# Patient Record
Sex: Female | Born: 1955 | Race: White | Hispanic: No | Marital: Married | Smoking: Never smoker
Health system: Southern US, Community
[De-identification: ages and names within clinical notes are randomized; demographics above are authoritative.]

---

## 2004-10-01 ENCOUNTER — Ambulatory Visit: Payer: Self-pay | Admitting: Gastroenterology

## 2008-04-15 ENCOUNTER — Emergency Department: Payer: Self-pay | Admitting: Unknown Physician Specialty

## 2009-09-17 ENCOUNTER — Telehealth: Payer: Self-pay | Admitting: Internal Medicine

## 2009-12-17 ENCOUNTER — Ambulatory Visit: Payer: Self-pay | Admitting: Gastroenterology

## 2011-08-30 ENCOUNTER — Emergency Department: Payer: Self-pay | Admitting: Unknown Physician Specialty

## 2012-12-09 ENCOUNTER — Ambulatory Visit: Payer: Self-pay | Admitting: Physician Assistant

## 2014-02-20 ENCOUNTER — Emergency Department: Payer: Self-pay | Admitting: Emergency Medicine

## 2018-10-18 ENCOUNTER — Encounter: Payer: Self-pay | Admitting: Podiatry

## 2018-10-18 ENCOUNTER — Ambulatory Visit: Payer: No Typology Code available for payment source | Admitting: Podiatry

## 2018-10-18 ENCOUNTER — Ambulatory Visit (INDEPENDENT_AMBULATORY_CARE_PROVIDER_SITE_OTHER): Payer: No Typology Code available for payment source

## 2018-10-18 VITALS — BP 134/68 | HR 73 | Resp 16

## 2018-10-18 DIAGNOSIS — M722 Plantar fascial fibromatosis: Secondary | ICD-10-CM | POA: Diagnosis not present

## 2018-10-18 DIAGNOSIS — M2011 Hallux valgus (acquired), right foot: Secondary | ICD-10-CM | POA: Diagnosis not present

## 2018-10-18 DIAGNOSIS — M2012 Hallux valgus (acquired), left foot: Secondary | ICD-10-CM

## 2018-10-18 NOTE — Progress Notes (Signed)
  Subjective:  Patient ID: Bonnie Duran, female    DOB: 08-07-1956,  MRN: [id number] HPI Chief Complaint  Patient presents with  . 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 New Patient (Initial Visit)    62 y.o. female presents with the above complaint.   ROS: Denies fever chills nausea vomiting muscle aches pains back pain chest pain shortness of breath.  No past medical history on file.   Current Outpatient Medications:  .  Loperamide HCl (IMODIUM PO), Take by mouth., Disp: , Rfl:  .  Wheat Dextrin (BENEFIBER PO), Take by mouth., Disp: , Rfl:   Allergies  Allergen Reactions  . Codeine Nausea Only  . Penicillin G Nausea Only    Pt does not do well with any antibiotics   Review of Systems Objective:   Vitals:   10/18/18 0841  BP: 134/68  Pulse: 73  Resp: 16    General: Well developed, nourished, in no acute distress, alert and oriented x3   Dermatological: Skin is warm, dry and supple bilateral. Nails x 10 are well maintained; remaining integument appears unremarkable at this time. There are no open sores, no preulcerative lesions, no rash or signs of infection present.  Vascular: Dorsalis Pedis artery and Posterior Tibial artery pedal pulses are 2/4 bilateral with immedate capillary fill time. Pedal hair growth present. No varicosities and no lower extremity edema present bilateral.   Neruologic: Grossly intact via light touch bilateral. Vibratory intact via tuning fork bilateral. Protective threshold with Semmes Wienstein monofilament intact to all pedal sites bilateral. Patellar and Achilles deep tendon reflexes 2+ bilateral. No Babinski or clonus noted bilateral.   Musculoskeletal: No gross boney pedal deformities bilateral. No pain, crepitus, or limitation noted with foot and ankle range of motion bilateral. Muscular strength 5/5 in all  groups tested bilateral.  Hallux abductovalgus deformity bilateral she also has some perineal pain left the plantar fascial pain that is significant no Achilles pain significant.  Gait: Unassisted, Nonantalgic.    Radiographs:  Radiographs taken today demonstrate osseously mature individual with pes planus.  No acute findings.  Assessment & Plan:   Assessment: Pes planus with mild peroneal tendinitis left  Plan: She was scanned for set of orthotics today.     Kaina Orengo T. Oak[place]Hyatt, [place]PM

## 2018-11-08 ENCOUNTER — Ambulatory Visit: Payer: No Typology Code available for payment source | Admitting: Orthotics

## 2018-11-08 DIAGNOSIS — M2011 Hallux valgus (acquired), right foot: Secondary | ICD-10-CM

## 2018-11-08 DIAGNOSIS — M722 Plantar fascial fibromatosis: Secondary | ICD-10-CM

## 2018-11-08 DIAGNOSIS — M2012 Hallux valgus (acquired), left foot: Secondary | ICD-10-CM

## 2018-11-08 NOTE — Progress Notes (Signed)
Patient came in today to pick up custom made foot orthotics.  The goals were accomplished and the patient reported no dissatisfaction with said orthotics.  Patient was advised of breakin period and how to report any issues. 

## 2018-11-15 ENCOUNTER — Ambulatory Visit: Payer: No Typology Code available for payment source | Admitting: Orthotics

## 2018-11-15 DIAGNOSIS — M722 Plantar fascial fibromatosis: Secondary | ICD-10-CM

## 2018-11-15 DIAGNOSIS — M2012 Hallux valgus (acquired), left foot: Secondary | ICD-10-CM

## 2018-11-15 DIAGNOSIS — M2011 Hallux valgus (acquired), right foot: Secondary | ICD-10-CM

## 2018-11-15 NOTE — Progress Notes (Signed)
Make following f/o adjustments:  Hug arch r>l; remove 2nd offload instead 1st offload b/l, 1st met head cutout, 4/small met pad b/l.

## 2018-12-13 ENCOUNTER — Other Ambulatory Visit: Payer: No Typology Code available for payment source | Admitting: Orthotics

## 2018-12-20 ENCOUNTER — Ambulatory Visit: Payer: No Typology Code available for payment source | Admitting: Orthotics

## 2018-12-20 DIAGNOSIS — M2012 Hallux valgus (acquired), left foot: Secondary | ICD-10-CM

## 2018-12-20 DIAGNOSIS — M2011 Hallux valgus (acquired), right foot: Secondary | ICD-10-CM

## 2018-12-20 DIAGNOSIS — M722 Plantar fascial fibromatosis: Secondary | ICD-10-CM

## 2018-12-20 NOTE — Progress Notes (Signed)
Patient came in today to pick up custom made foot orthotics.  The goals were accomplished and the patient reported no dissatisfaction with said orthotics.  Patient was advised of breakin period and how to report any issues. 

## 2020-06-08 ENCOUNTER — Emergency Department (HOSPITAL_COMMUNITY): Payer: Medicare HMO

## 2020-06-08 ENCOUNTER — Encounter (HOSPITAL_COMMUNITY): Payer: Self-pay | Admitting: Emergency Medicine

## 2020-06-08 ENCOUNTER — Emergency Department (HOSPITAL_COMMUNITY)
Admission: EM | Admit: 2020-06-08 | Discharge: 2020-06-08 | Disposition: A | Discharge status: Home / Self Care | Payer: Medicare HMO | Attending: Emergency Medicine | Admitting: Emergency Medicine

## 2020-06-08 DIAGNOSIS — Y999 Unspecified external cause status: Secondary | ICD-10-CM | POA: Insufficient documentation

## 2020-06-08 DIAGNOSIS — S4991XA Unspecified injury of right shoulder and upper arm, initial encounter: Secondary | ICD-10-CM | POA: Diagnosis present

## 2020-06-08 DIAGNOSIS — W06XXXA Fall from bed, initial encounter: Secondary | ICD-10-CM | POA: Insufficient documentation

## 2020-06-08 DIAGNOSIS — Y92003 Bedroom of unspecified non-institutional (private) residence as the place of occurrence of the external cause: Secondary | ICD-10-CM | POA: Insufficient documentation

## 2020-06-08 DIAGNOSIS — S42214A Unspecified nondisplaced fracture of surgical neck of right humerus, initial encounter for closed fracture: Secondary | ICD-10-CM | POA: Insufficient documentation

## 2020-06-08 DIAGNOSIS — Y939 Activity, unspecified: Secondary | ICD-10-CM | POA: Diagnosis not present

## 2020-06-08 MED ORDER — HYDROCODONE-ACETAMINOPHEN 5-325 MG PO TABS: 1.0000 | ORAL_TABLET | Freq: Four times a day (QID) | ORAL | 0 refills | Status: AC | PRN

## 2020-06-08 MED ORDER — CYCLOBENZAPRINE HCL 5 MG PO TABS
5.0000 mg | ORAL_TABLET | Freq: Two times a day (BID) | ORAL | 0 refills | Status: DC | PRN
Start: 2020-06-08 — End: 2020-06-08

## 2020-06-08 MED ORDER — ONDANSETRON 4 MG PO TBDP
4.0000 mg | ORAL_TABLET | Freq: Three times a day (TID) | ORAL | 0 refills | Status: DC | PRN
Start: 2020-06-08 — End: 2020-06-08

## 2020-06-08 MED ORDER — KETOROLAC TROMETHAMINE 60 MG/2ML IM SOLN
15.0000 mg | Freq: Once | INTRAMUSCULAR | Status: AC
  Administered 2020-06-08: 15 mg via INTRAMUSCULAR
  Filled 2020-06-08: qty 2

## 2020-06-08 MED ORDER — IBUPROFEN 600 MG PO TABS
600.0000 mg | ORAL_TABLET | Freq: Four times a day (QID) | ORAL | 0 refills | Status: AC | PRN
Start: 2020-06-08 — End: ?

## 2020-06-08 MED ORDER — HYDROCODONE-ACETAMINOPHEN 5-325 MG PO TABS: 1.0000 | ORAL_TABLET | Freq: Four times a day (QID) | ORAL | 0 refills | Status: DC | PRN

## 2020-06-08 MED ORDER — FENTANYL CITRATE (PF) 100 MCG/2ML IJ SOLN: 25.0000 ug | Freq: Once | INTRAMUSCULAR | Status: DC

## 2020-06-08 MED ORDER — ONDANSETRON 4 MG PO TBDP: 4.0000 mg | ORAL_TABLET | Freq: Three times a day (TID) | ORAL | 0 refills | Status: AC | PRN

## 2020-06-08 MED ORDER — OXYCODONE-ACETAMINOPHEN 5-325 MG PO TABS
1.0000 | ORAL_TABLET | Freq: Once | ORAL | Status: AC
  Administered 2020-06-08: 1 via ORAL
  Filled 2020-06-08: qty 1

## 2020-06-08 MED ORDER — CYCLOBENZAPRINE HCL 5 MG PO TABS
5.0000 mg | ORAL_TABLET | Freq: Two times a day (BID) | ORAL | 0 refills | Status: AC | PRN
Start: 2020-06-08 — End: ?

## 2020-06-08 MED ORDER — IBUPROFEN 600 MG PO TABS: 600.0000 mg | ORAL_TABLET | Freq: Four times a day (QID) | ORAL | 0 refills | Status: DC | PRN

## 2020-06-08 NOTE — ED Triage Notes (Signed)
Pt. Stated, I went to sit on the bed and missed the bed fell and grabbed with rt. Arm. Pain in rt. Upper arm and shoulder.

## 2020-06-08 NOTE — Discharge Instructions (Addendum)
1. Medications: Alternate 600 mg of ibuprofen and [phone] mg of Tylenol every 3 hours as needed for pain. Do not exceed 4000 mg of Tylenol daily.  Take ibuprofen with food to avoid upset stomach issues. You can take hydrocodone as needed for severe pain but do not drive, drink alcohol, or operate heavy machinery while taking this medicine as it can cause drowsiness.  Be aware this medicine also contains Tylenol and do not exceed more than 4000 mg of Tylenol daily.  You can also take Flexeril which is a muscle relaxant.  This medicine can also cause drowsiness.  You can cut both of these tablets in half if they are strong.  I have prescribed Zofran to be taken as needed for nausea as a result of the medications. 2. Treatment: rest, ice, wear shoulder sling, drink plenty of fluids, gentle stretching 3. Follow Up: Please followup with orthopedics as directed for discussion of your diagnoses and further evaluation after today's visit; call first thing Tuesday morning to schedule follow-up.; Please return to the ER for worsening symptoms or other concerns such as worsening swelling, redness of the skin, fevers, loss of pulses, or loss of feeling

## 2020-06-08 NOTE — ED Provider Notes (Signed)
[facility] EMERGENCY DEPARTMENT Provider Note   CSN: [id number] Arrival date & time: 06/08/20  6644     History Chief Complaint  Patient presents with  . Arm Pain  . Shoulder Pain    Bonnie Duran is a 64 y.o. female with history of IBS, osteoporosis presents for evaluation of acute onset, persistent severe right upper extremity pain secondary to injury just prior to arrival.  She reports that she was attempting to sit on the edge of her bed with her right upper extremity extended when she missed the edge of the bed, fell forward and landed on her right upper extremity.  She reports severe pain around the humeral head extending down the extremity and up into the neck.  Has not been able to move the extremity since the injury.  Denies head injury or loss of consciousness.  She does feel some numbness and tingling to her fingers.  She is right-hand dominant.  The history is provided by the patient.  Shoulder Pain      History reviewed. No pertinent past medical history.  There are no problems to display for this patient.   History reviewed. No pertinent surgical history.   OB History   No obstetric history on file.     No family history on file.  Social History   Tobacco Use  . Smoking status: Never Smoker  . Smokeless tobacco: Never Used  Substance Use Topics  . Alcohol use: Yes  . Drug use: Not Currently    Home Medications Prior to Admission medications   Medication Sig Start Date End Date Taking? Authorizing Provider  cyclobenzaprine (FLEXERIL) 5 MG tablet Take 1 tablet (5 mg total) by mouth 2 (two) times daily as needed for muscle spasms. 06/08/20   Melene Plan, DO  HYDROcodone-acetaminophen (NORCO/VICODIN) 5-325 MG tablet Take 1 tablet by mouth every 6 (six) hours as needed for severe pain. 06/08/20   Melene Plan, DO  ibuprofen (ADVIL) 600 MG tablet Take 1 tablet (600 mg total) by mouth every 6 (six) hours as needed. 06/08/20   Melene Plan, DO    Loperamide HCl (IMODIUM PO) Take by mouth.    [provider]  ondansetron (ZOFRAN ODT) 4 MG disintegrating tablet Take 1 tablet (4 mg total) by mouth every 8 (eight) hours as needed for nausea or vomiting. 06/08/20   Melene Plan, DO  Wheat Dextrin (BENEFIBER PO) Take by mouth.    [provider]    Allergies    Codeine and Penicillin g  Review of Systems   Review of Systems  Musculoskeletal: Positive for arthralgias.  Neurological: Positive for numbness. Negative for syncope and headaches.  All other systems reviewed and are negative.   Physical Exam Updated Vital Signs BP 115/68 (BP Location: Left Arm)   Pulse 65   Temp 97.7 F (36.5 C) (Oral)   Resp 20   SpO2 98%   Physical Exam Vitals and nursing note reviewed.  Constitutional:      General: She is not in acute distress.    Appearance: She is well-developed.  HENT:     Head: Normocephalic and atraumatic.  Eyes:     General:        Right eye: No discharge.        Left eye: No discharge.     Conjunctiva/sclera: Conjunctivae normal.  Neck:     Vascular: No JVD.     Trachea: No tracheal deviation.     Comments: No midline cervical  spine tenderness, deformity, crepitus, or step-off.  She is kyphotic.  There is right paracervical muscle tenderness and spasm noted. Cardiovascular:     Rate and Rhythm: Normal rate.     Pulses: Normal pulses.     Comments: 2+ radial pulses bilaterally. Pulmonary:     Effort: Pulmonary effort is normal.  Abdominal:     General: There is no distension.  Musculoskeletal:        General: Tenderness present.     Cervical back: Normal range of motion and neck supple.     Comments: Significant limitation in the setting of pain.  She has tenderness to palpation of the right humeral head and proximal humerus.  No tenderness to palpation of the right elbow, forearm, wrist or digits.  Skin:    General: Skin is warm and dry.     Findings: No erythema.  Neurological:     Mental  Status: She is alert.     Comments: Slightly altered sensation to light touch of the right upper extremity as compared to the left.  Good grip strength bilaterally.  Psychiatric:        Behavior: Behavior normal.     ED Results / Procedures / Treatments   Labs (all labs ordered are listed, but only abnormal results are displayed) Labs Reviewed - No data to display  EKG None  Radiology DG Shoulder Right  Result Date: 06/08/2020 CLINICAL DATA:  Fall, pain EXAM: RIGHT SHOULDER - 2+ VIEW; RIGHT HUMERUS - 2+ VIEW COMPARISON:  None. FINDINGS: Impacted fracture of the proximal right humerus involving the surgical neck. No fracture or dislocation of the distal right humerus. The acromioclavicular and glenohumeral joints are preserved. Partially imaged right chest is unremarkable. IMPRESSION: 1. Impacted fracture of the proximal right humerus involving the surgical neck. Consider CT to further evaluate fracture anatomy. 2. No fracture or dislocation of the distal right humerus. Electronically Signed   By: Lauralyn Primes M.D.   On: 06/08/2020 10:51   DG Humerus Right  Result Date: 06/08/2020 CLINICAL DATA:  Fall, pain EXAM: RIGHT SHOULDER - 2+ VIEW; RIGHT HUMERUS - 2+ VIEW COMPARISON:  None. FINDINGS: Impacted fracture of the proximal right humerus involving the surgical neck. No fracture or dislocation of the distal right humerus. The acromioclavicular and glenohumeral joints are preserved. Partially imaged right chest is unremarkable. IMPRESSION: 1. Impacted fracture of the proximal right humerus involving the surgical neck. Consider CT to further evaluate fracture anatomy. 2. No fracture or dislocation of the distal right humerus. Electronically Signed   By: Lauralyn Primes M.D.   On: 06/08/2020 10:51    Procedures Procedures (including critical care time)  Medications Ordered in ED Medications  oxyCODONE-acetaminophen (PERCOCET/ROXICET) 5-325 MG per tablet 1 tablet (1 tablet Oral Given 06/08/20 1004)    ketorolac (TORADOL) injection 15 mg (15 mg Intramuscular Given 06/08/20 1302)    ED Course  I have reviewed the triage vital signs and the nursing notes.  Pertinent labs & imaging results that were available during my care of the patient were reviewed by me and considered in my medical decision making (see chart for details).    MDM Rules/Calculators/A&P                          Patient presenting for evaluation of right shoulder/upper arm pain secondary to mechanical fall.  Or loss of consciousness.  No midline spine tenderness.  Has a history of osteoporosis.  She is afebrile, appears  uncomfortable but vital signs are stable.  Compartments are soft.  She is able to move the extremity distally but limited proximally.  Radiographs confirm an impacted fracture involving the proximal right humeral neck.  Examination of chest otherwise atraumatic.  Placed in shoulder sling and she will follow-up with orthopedics outpatient.  We will give Flexeril for muscle relaxation, hydrocodone for severe breakthrough pain.  Will give Zofran as needed for nausea related to medication use.  Discussed potential side effects.  Discussed ED return precautions.  And hemodynamically stable for discharge at this time.  Patient seen and evaluated by Dr. Adela Lank who agrees with assessment and plan at this time  Final Clinical Impression(s) / ED Diagnoses Final diagnoses:  Closed nondisplaced fracture of surgical neck of right humerus, unspecified fracture morphology, initial encounter    Rx / DC Orders ED Discharge Orders         Ordered    ibuprofen (ADVIL) 600 MG tablet  Every 6 hours PRN,   Status:  Discontinued     Reprint     06/08/20 1152    HYDROcodone-acetaminophen (NORCO/VICODIN) 5-325 MG tablet  Every 6 hours PRN,   Status:  Discontinued     Reprint     06/08/20 1152    cyclobenzaprine (FLEXERIL) 5 MG tablet  2 times daily PRN,   Status:  Discontinued     Reprint     06/08/20 1152    ondansetron (ZOFRAN  ODT) 4 MG disintegrating tablet  Every 8 hours PRN,   Status:  Discontinued     Reprint     06/08/20 1152    cyclobenzaprine (FLEXERIL) 5 MG tablet  2 times daily PRN     Discontinue  Reprint     06/08/20 1255    HYDROcodone-acetaminophen (NORCO/VICODIN) 5-325 MG tablet  Every 6 hours PRN     Discontinue  Reprint     06/08/20 1255    ibuprofen (ADVIL) 600 MG tablet  Every 6 hours PRN     Discontinue  Reprint     06/08/20 1255    ondansetron (ZOFRAN ODT) 4 MG disintegrating tablet  Every 8 hours PRN     Discontinue  Reprint     06/08/20 1255           Rylyn Zawistowski, [place] A, PA-C 06/08/20 1317    Melene Plan, DO 06/08/20 1428

## 2020-11-25 ENCOUNTER — Ambulatory Visit: Payer: Medicare HMO

## 2020-12-12 ENCOUNTER — Other Ambulatory Visit
Admission: RE | Admit: 2020-12-12 | Discharge: 2020-12-12 | Disposition: A | Discharge status: Home / Self Care | Payer: Medicare HMO | Source: Ambulatory Visit | Attending: Gastroenterology | Admitting: Gastroenterology

## 2020-12-12 DIAGNOSIS — Z01818 Encounter for other preprocedural examination: Secondary | ICD-10-CM | POA: Insufficient documentation

## 2020-12-12 DIAGNOSIS — Z20822 Contact with and (suspected) exposure to covid-19: Secondary | ICD-10-CM | POA: Diagnosis not present

## 2020-12-13 LAB — SARS CORONAVIRUS 2 (TAT 6-24 HRS): SARS Coronavirus 2: NEGATIVE

## 2020-12-16 ENCOUNTER — Encounter: Payer: Self-pay | Admitting: *Deleted

## 2020-12-16 ENCOUNTER — Ambulatory Visit
Admission: RE | Admit: 2020-12-16 | Discharge: 2020-12-16 | Disposition: A | Discharge status: Home / Self Care | Payer: Medicare HMO | Attending: Gastroenterology | Admitting: Gastroenterology

## 2020-12-16 ENCOUNTER — Encounter
Admission: RE | Disposition: A | Discharge status: Home / Self Care | Payer: Self-pay | Source: Home / Self Care | Attending: Gastroenterology

## 2020-12-16 ENCOUNTER — Ambulatory Visit: Payer: Medicare HMO | Admitting: Anesthesiology

## 2020-12-16 ENCOUNTER — Other Ambulatory Visit: Payer: Self-pay

## 2020-12-16 DIAGNOSIS — K5730 Diverticulosis of large intestine without perforation or abscess without bleeding: Secondary | ICD-10-CM | POA: Diagnosis not present

## 2020-12-16 DIAGNOSIS — Z885 Allergy status to narcotic agent status: Secondary | ICD-10-CM | POA: Diagnosis not present

## 2020-12-16 DIAGNOSIS — K640 First degree hemorrhoids: Secondary | ICD-10-CM | POA: Diagnosis not present

## 2020-12-16 DIAGNOSIS — Z880 Allergy status to penicillin: Secondary | ICD-10-CM | POA: Insufficient documentation

## 2020-12-16 DIAGNOSIS — Z79899 Other long term (current) drug therapy: Secondary | ICD-10-CM | POA: Insufficient documentation

## 2020-12-16 DIAGNOSIS — Z791 Long term (current) use of non-steroidal anti-inflammatories (NSAID): Secondary | ICD-10-CM | POA: Diagnosis not present

## 2020-12-16 DIAGNOSIS — K6289 Other specified diseases of anus and rectum: Secondary | ICD-10-CM | POA: Insufficient documentation

## 2020-12-16 DIAGNOSIS — Z1211 Encounter for screening for malignant neoplasm of colon: Secondary | ICD-10-CM | POA: Insufficient documentation

## 2020-12-16 HISTORY — PX: COLONOSCOPY: SHX5424

## 2020-12-16 SURGERY — COLONOSCOPY
Anesthesia: General

## 2020-12-16 MED ORDER — PROPOFOL 500 MG/50ML IV EMUL
INTRAVENOUS | Status: AC
  Filled 2020-12-16: qty 50

## 2020-12-16 MED ORDER — LIDOCAINE HCL (PF) 2 % IJ SOLN
INTRAMUSCULAR | Status: AC
  Filled 2020-12-16: qty 5

## 2020-12-16 MED ORDER — SODIUM CHLORIDE 0.9 % IV SOLN: INTRAVENOUS | Status: DC

## 2020-12-16 MED ORDER — EPHEDRINE SULFATE 50 MG/ML IJ SOLN
INTRAMUSCULAR | Status: DC | PRN
  Administered 2020-12-16: 10 mg via INTRAVENOUS

## 2020-12-16 MED ORDER — PROPOFOL 10 MG/ML IV BOLUS
INTRAVENOUS | Status: DC | PRN
  Administered 2020-12-16: 30 mg via INTRAVENOUS
  Administered 2020-12-16: 20 mg via INTRAVENOUS
  Administered 2020-12-16: 50 mg via INTRAVENOUS
  Administered 2020-12-16: 10 mg via INTRAVENOUS
  Administered 2020-12-16: 20 mg via INTRAVENOUS

## 2020-12-16 MED ORDER — PROPOFOL 500 MG/50ML IV EMUL
INTRAVENOUS | Status: DC | PRN
  Administered 2020-12-16: 180 ug/kg/min via INTRAVENOUS

## 2020-12-16 NOTE — Transfer of Care (Signed)
Immediate Anesthesia Transfer of Care Note  Patient: Bonnie Duran  Procedure(s) Performed: COLONOSCOPY (N/A )  Patient Location: PACU  Anesthesia Type:General  Level of Consciousness: awake and alert   Airway & Oxygen Therapy: Patient Spontanous Breathing and Patient connected to nasal cannula oxygen  Post-op Assessment: Report given to RN and Post -op Vital signs reviewed and stable  Post vital signs: Reviewed and stable  Last Vitals:  Vitals Value Taken Time  BP 107/58 12/16/20 1008  Temp 36.2 C 12/16/20 1007  Pulse 94 12/16/20 1009  Resp 21 12/16/20 1009  SpO2 100 % 12/16/20 1009  Vitals shown include unvalidated device data.  Last Pain:  Vitals:   12/16/20 1007  TempSrc: Temporal  PainSc: 0-No pain         Complications: No complications documented.

## 2020-12-16 NOTE — Op Note (Signed)
[facility] Patient Name: Bonnie Duran Procedure Date: 12/16/2020 9:25 AM MRN: [id number] Account #: [id number] Date of Birth: 08/10/1956 Admit Type: Outpatient Age: 65 Room: [facility] ENDO ROOM 1 Gender: Female Note Status: Finalized Procedure:             Colonoscopy Indications:           Screening for colorectal malignant neoplasm Providers:             Andrey Farmer MD, MD Referring MD:          Irven Easterly. Kary Kos, MD (Referring MD) Medicines:             Monitored Anesthesia Care Complications:         No immediate complications. Estimated blood loss:                         Minimal. Procedure:             Pre-Anesthesia Assessment:                        - Prior to the procedure, a History and Physical was                         performed, and patient medications and allergies were                         reviewed. The patient is competent. The risks and                         benefits of the procedure and the sedation options and                         risks were discussed with the patient. All questions                         were answered and informed consent was obtained.                         Patient identification and proposed procedure were                         verified by the physician, the nurse, the anesthetist                         and the technician in the endoscopy suite. Mental                         Status Examination: alert and oriented. Airway                         Examination: normal oropharyngeal airway and neck                         mobility. Respiratory Examination: clear to                         auscultation. CV Examination: normal. Prophylactic  Antibiotics: The patient does not require prophylactic                         antibiotics. Prior Anticoagulants: The patient has                         taken no previous anticoagulant or antiplatelet                         agents. ASA  Grade Assessment: II - A patient with mild                         systemic disease. After reviewing the risks and                         benefits, the patient was deemed in satisfactory                         condition to undergo the procedure. The anesthesia                         plan was to use monitored anesthesia care (MAC).                         Immediately prior to administration of medications,                         the patient was re-assessed for adequacy to receive                         sedatives. The heart rate, respiratory rate, oxygen                         saturations, blood pressure, adequacy of pulmonary                         ventilation, and response to care were monitored                         throughout the procedure. The physical status of the                         patient was re-assessed after the procedure.                        After obtaining informed consent, the colonoscope was                         passed under direct vision. Throughout the procedure,                         the patient's blood pressure, pulse, and oxygen                         saturations were monitored continuously. The                         Colonoscope was introduced through the anus and  advanced to the the cecum, identified by appendiceal                         orifice and ileocecal valve. The colonoscopy was                         somewhat difficult due to a redundant colon. The                         patient tolerated the procedure well. The quality of                         the bowel preparation was good. Findings:      The perianal and digital rectal examinations were normal.      A localized area of mildly erythematous mucosa was found in the rectum.       Appeared as if sitting on top of a hemorrhoid although the tissue did       not appear hemorrhoidal in nature. Biopsies were taken with a cold       forceps for histology. Estimated  blood loss was minimal.      A few small-mouthed diverticula were found in the sigmoid colon.      Non-bleeding internal hemorrhoids were found during retroflexion. The       hemorrhoids were Grade I (internal hemorrhoids that do not prolapse).      The exam was otherwise without abnormality on direct and retroflexion       views. Impression:            - Erythematous mucosa in the rectum. Biopsied.                        - Diverticulosis in the sigmoid colon.                        - Non-bleeding internal hemorrhoids.                        - The examination was otherwise normal on direct and                         retroflexion views. Recommendation:        - Discharge patient to home.                        - Resume previous diet.                        - Continue present medications.                        - Await pathology results.                        - Repeat colonoscopy for surveillance based on                         pathology results.                        - Return to referring physician as previously  scheduled. Procedure Code(s):     --- Professional ---                        [phone], Colonoscopy, flexible; with biopsy, single or                         multiple Diagnosis Code(s):     --- Professional ---                        Z12.11, Encounter for screening for malignant neoplasm                         of colon                        K62.89, Other specified diseases of anus and rectum                        K64.0, First degree hemorrhoids                        K57.30, Diverticulosis of large intestine without                         perforation or abscess without bleeding CPT copyright 2019 American Medical Association. All rights reserved. The codes documented in this report are preliminary and upon coder review may  be revised to meet current compliance requirements. Andrey Farmer MD, MD 12/16/2020 10:08:52 AM Number of Addenda: 0 Note  Initiated On: 12/16/2020 9:25 AM Scope Withdrawal Time: 0 hours 13 minutes 36 seconds  Total Procedure Duration: 0 hours 28 minutes 54 seconds  Estimated Blood Loss:  Estimated blood loss was minimal.      [facility]

## 2020-12-16 NOTE — Anesthesia Postprocedure Evaluation (Signed)
Anesthesia Post Note  Patient: Bonnie Duran  Procedure(s) Performed: COLONOSCOPY (N/A )  Patient location during evaluation: Endoscopy Anesthesia Type: General Level of consciousness: awake and alert Pain management: pain level controlled Vital Signs Assessment: post-procedure vital signs reviewed and stable Respiratory status: spontaneous breathing and respiratory function stable Cardiovascular status: stable Anesthetic complications: no   No complications documented.   Last Vitals:  Vitals:   12/16/20 1007 12/16/20 1027  BP: (!) 107/58 (!) 108/57  Pulse:    Resp:    Temp: (!) 36.2 C   SpO2:      Last Pain:  Vitals:   12/16/20 1027  TempSrc:   PainSc: 0-No pain                 Scotty Pinder K

## 2020-12-16 NOTE — Anesthesia Procedure Notes (Signed)
Date/Time: 12/16/2020 9:30 AM Performed by: Henrietta Hoover, CRNA Pre-anesthesia Checklist: Patient identified, Emergency Drugs available, Suction available, Patient being monitored and Timeout performed Oxygen Delivery Method: Nasal cannula Placement Confirmation: positive ETCO2

## 2020-12-16 NOTE — Anesthesia Preprocedure Evaluation (Signed)
Anesthesia Evaluation  Patient identified by MRN, date of birth, ID band Patient awake    Reviewed: Allergy & Precautions, NPO status , Patient's Chart, lab work & pertinent test results  History of Anesthesia Complications Negative for: history of anesthetic complications  Airway Mallampati: II       Dental  (+) Partial Upper   Pulmonary neg sleep apnea, neg COPD, Not current smoker,           Cardiovascular (-) hypertension(-) Past MI and (-) CHF (-) dysrhythmias (-) Valvular Problems/Murmurs     Neuro/Psych neg Seizures    GI/Hepatic Neg liver ROS, neg GERD  ,  Endo/Other  neg diabetes  Renal/GU negative Renal ROS     Musculoskeletal   Abdominal   Peds  Hematology   Anesthesia Other Findings   Reproductive/Obstetrics                             Anesthesia Physical Anesthesia Plan  ASA: I  Anesthesia Plan: General   Post-op Pain Management:    Induction: Intravenous  PONV Risk Score and Plan: 3 and Propofol infusion and TIVA  Airway Management Planned: Nasal Cannula  Additional Equipment:   Intra-op Plan:   Post-operative Plan:   Informed Consent: I have reviewed the patients History and Physical, chart, labs and discussed the procedure including the risks, benefits and alternatives for the proposed anesthesia with the patient or authorized representative who has indicated his/her understanding and acceptance.       Plan Discussed with:   Anesthesia Plan Comments:         Anesthesia Quick Evaluation

## 2020-12-16 NOTE — Interval H&P Note (Signed)
History and Physical Interval Note:  12/16/2020 9:21 AM  Bonnie Duran  has presented today for surgery, with the diagnosis of COLON CANCER SCREENING.  The various methods of treatment have been discussed with the patient and family. After consideration of risks, benefits and other options for treatment, the patient has consented to  Procedure(s): COLONOSCOPY (N/A) as a surgical intervention.  The patient's history has been reviewed, patient examined, no change in status, stable for surgery.  I have reviewed the patient's chart and labs.  Questions were answered to the patient's satisfaction.     Regis Bill  Ok to proceed with colonoscopy

## 2020-12-16 NOTE — H&P (Signed)
Outpatient short stay form Pre-procedure 12/16/2020 9:19 AM Merlyn Lot MD, MPH  Primary Physician: Dr. Burnett Sheng  Reason for visit:  Screening  History of present illness:   65 y/o lady with no significant history and no family history of GI malignancies here for screening colonoscopy. Had normal colonoscopy in 2011. No abdominal surgeries. No blood thinners. No new GI symptoms.    Current Facility-Administered Medications:  .  0.9 %  sodium chloride infusion, , Intravenous, Continuous, Shamiracle Gorden, Rossie Muskrat, MD, Last Rate: 20 mL/hr at 12/16/20 0910, New Bag at 12/16/20 0910  Medications Prior to Admission  Medication Sig Dispense Refill Last Dose  . Loperamide HCl (IMODIUM PO) Take by mouth.   12/15/2020 at Unknown time  . cyclobenzaprine (FLEXERIL) 5 MG tablet Take 1 tablet (5 mg total) by mouth 2 (two) times daily as needed for muscle spasms. (Patient not taking: Reported on 12/16/2020) 20 tablet 0 Not Taking at Unknown time  . HYDROcodone-acetaminophen (NORCO/VICODIN) 5-325 MG tablet Take 1 tablet by mouth every 6 (six) hours as needed for severe pain. (Patient not taking: Reported on 12/16/2020) 10 tablet 0 Not Taking at Unknown time  . ibuprofen (ADVIL) 600 MG tablet Take 1 tablet (600 mg total) by mouth every 6 (six) hours as needed. (Patient not taking: Reported on 12/16/2020) 30 tablet 0 Not Taking at Unknown time  . ondansetron (ZOFRAN ODT) 4 MG disintegrating tablet Take 1 tablet (4 mg total) by mouth every 8 (eight) hours as needed for nausea or vomiting. 10 tablet 0   . Wheat Dextrin (BENEFIBER PO) Take by mouth.   12/14/2020     Allergies  Allergen Reactions  . Codeine Nausea Only  . Penicillin G Nausea Only    Pt does not do well with any antibiotics     History reviewed. No pertinent past medical history.  Review of systems:  Otherwise negative.    Physical Exam  Gen: Alert, oriented. Appears stated age.  HEENT: PERRLA. Lungs: No respiratory distress CV:  RRR Abd: soft, benign, no masses Ext: No edema    Planned procedures: Proceed with colonoscopy. The patient understands the nature of the planned procedure, indications, risks, alternatives and potential complications including but not limited to bleeding, infection, perforation, damage to internal organs and possible oversedation/side effects from anesthesia. The patient agrees and gives consent to proceed.  Please refer to procedure notes for findings, recommendations and patient disposition/instructions.     Merlyn Lot MD, MPH Gastroenterology 12/16/2020  9:19 AM

## 2020-12-17 ENCOUNTER — Encounter: Payer: Self-pay | Admitting: Gastroenterology

## 2020-12-17 LAB — SURGICAL PATHOLOGY

## 2020-12-27 IMAGING — DX DG HUMERUS 2V *R*
2 series · 2 of 2 positions shown · non-contrast
Comparison: None.

CLINICAL DATA: Fall, pain

EXAM:
RIGHT SHOULDER - 2+ VIEW; RIGHT HUMERUS - 2+ VIEW

[humerus ap]
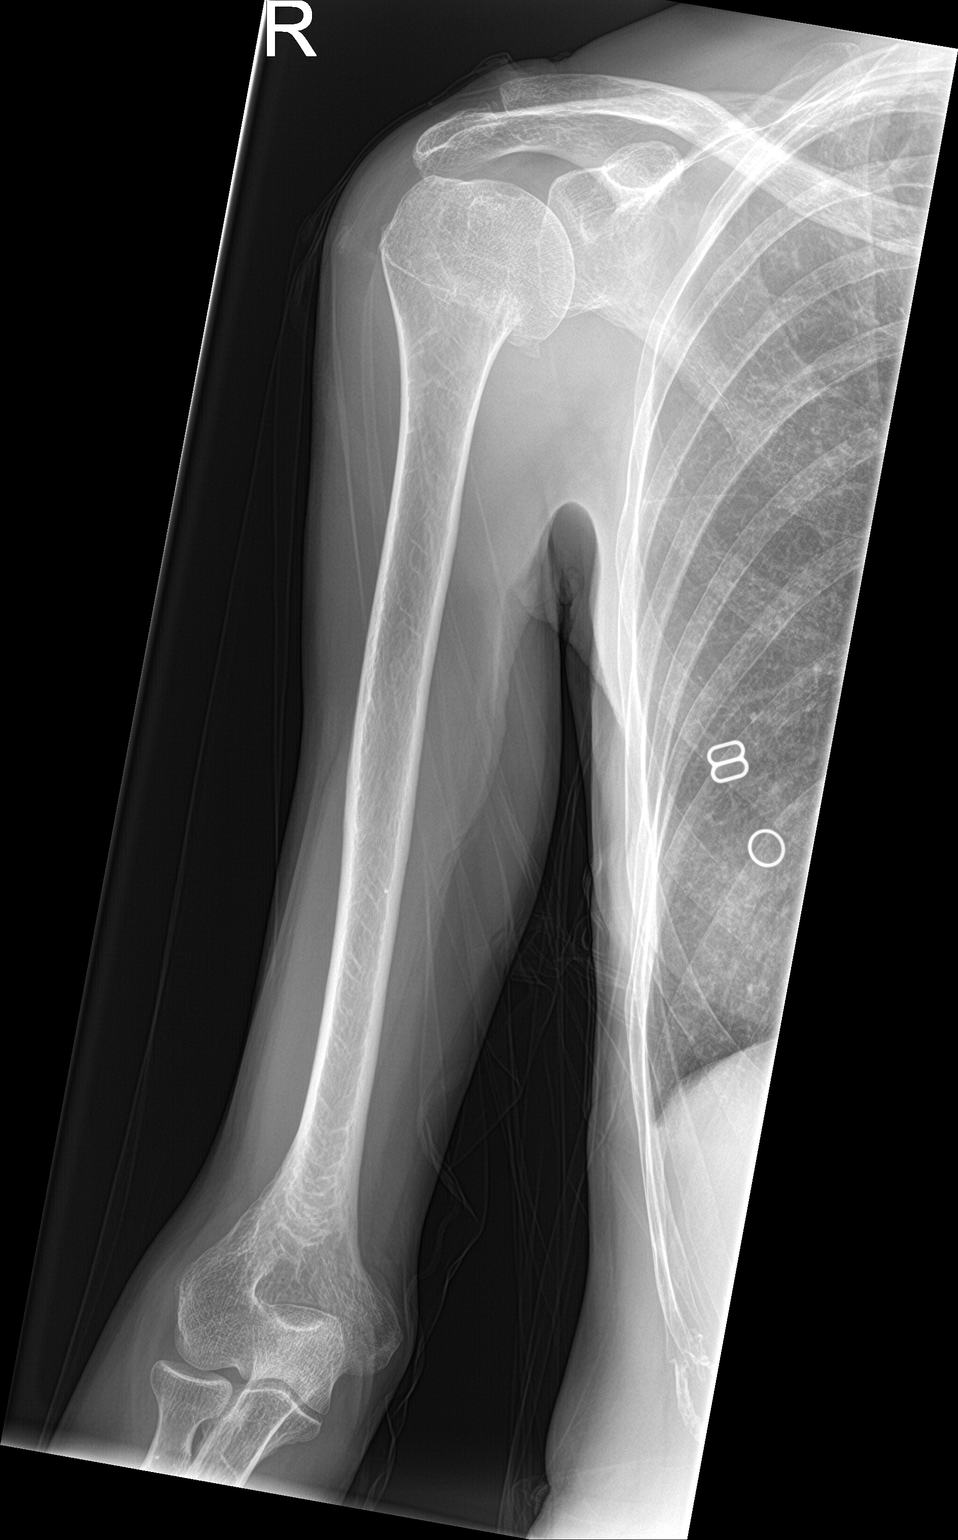

[humerus lat]
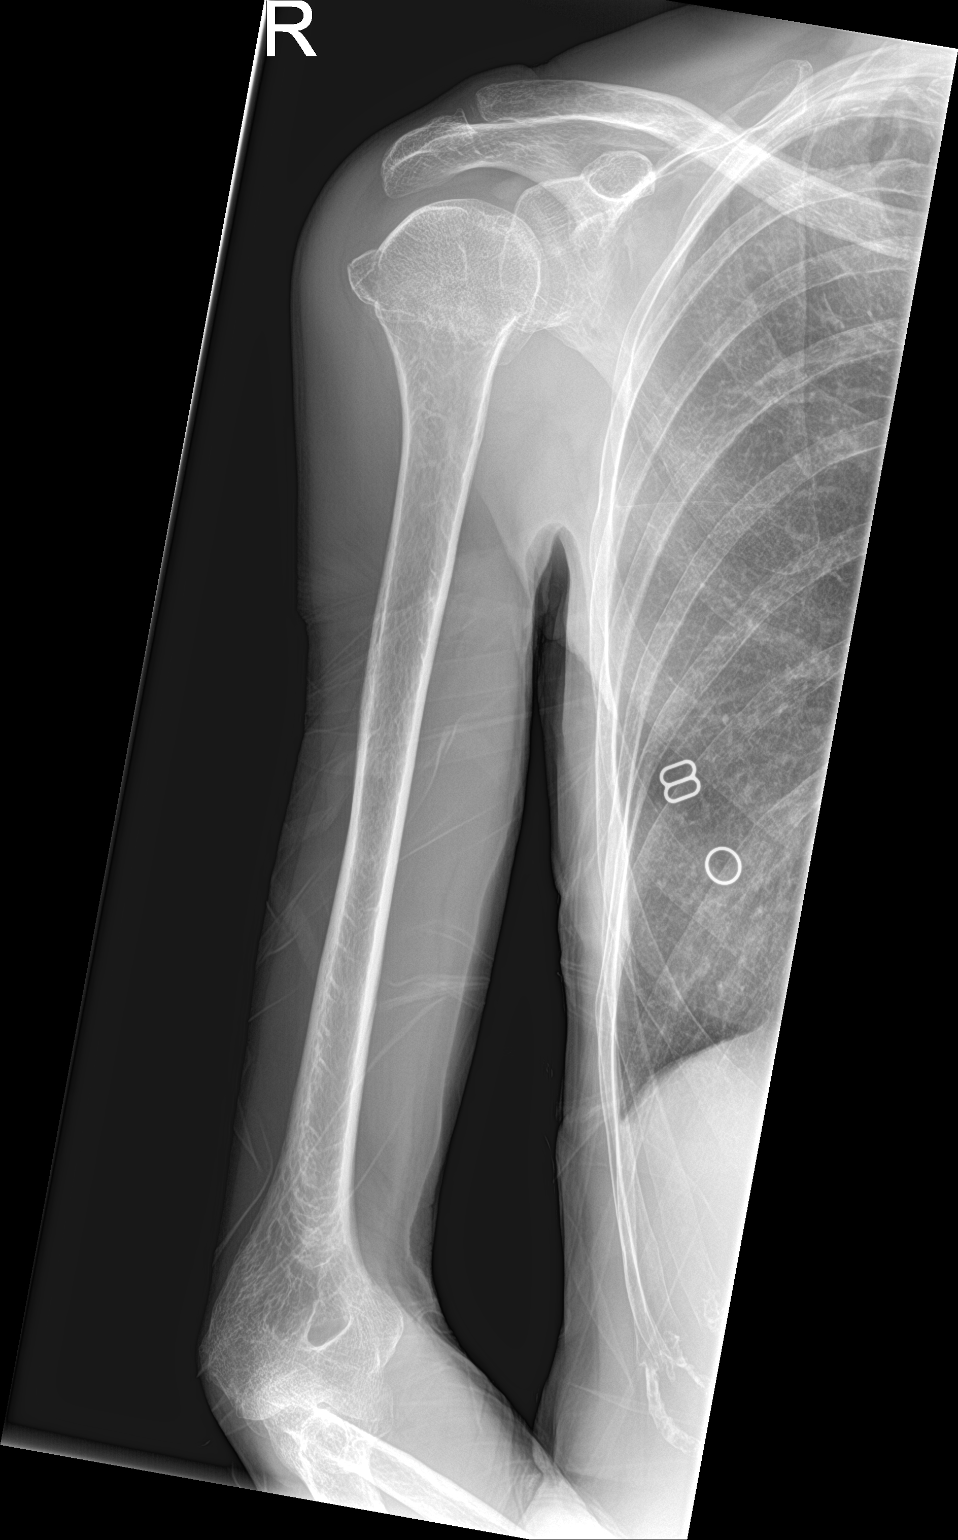

[2 of 2 positions shown; findings below may reference images not displayed]

FINDINGS: Impacted fracture of the proximal right humerus involving the
surgical neck. No fracture or dislocation of the distal right
humerus. The acromioclavicular and glenohumeral joints are
preserved. Partially imaged right chest is unremarkable.
IMPRESSION: 1. Impacted fracture of the proximal right humerus involving the
surgical neck. Consider CT to further evaluate fracture anatomy.
2. No fracture or dislocation of the distal right humerus.

## 2021-01-06 ENCOUNTER — Ambulatory Visit: Payer: Medicare HMO | Attending: Internal Medicine

## 2021-01-06 ENCOUNTER — Other Ambulatory Visit: Payer: Self-pay | Admitting: Internal Medicine

## 2021-01-06 DIAGNOSIS — Z23 Encounter for immunization: Secondary | ICD-10-CM

## 2021-01-06 NOTE — Progress Notes (Signed)
   Covid-19 Vaccination Clinic  Name:  Bonnie Duran    MRN: [id number] DOB: November 30, 1956  01/06/2021  Bonnie Duran was observed post Covid-19 immunization for 15 minutes without incident. She was provided with Vaccine Information Sheet and instruction to access the V-Safe system.   Bonnie Duran was instructed to call 911 with any severe reactions post vaccine: Marland Kitchen Difficulty breathing  . Swelling of face and throat  . A fast heartbeat  . A bad rash all over body  . Dizziness and weakness   Immunizations Administered    Name Date Dose VIS Date Route   Moderna Covid-19 Booster Vaccine 01/06/2021  9:57 AM 0.25 mL 09/24/2020 Intramuscular   Manufacturer: Moderna   Lot: [id number]   NDC: 89373-428-76
